# Patient Record
Sex: Male | Born: 1995 | Race: Black or African American | Hispanic: No | Marital: Single | Smoking: Current some day smoker
Health system: Southern US, Community
[De-identification: ages and names within clinical notes are randomized; demographics above are authoritative.]

## PROBLEM LIST (undated history)

## (undated) DIAGNOSIS — Z789 Other specified health status: Secondary | ICD-10-CM

## (undated) HISTORY — PX: OTHER SURGICAL HISTORY: SHX169

## (undated) HISTORY — PX: NO PAST SURGERIES: SHX2092

---

## 2018-04-15 ENCOUNTER — Encounter (HOSPITAL_BASED_OUTPATIENT_CLINIC_OR_DEPARTMENT_OTHER): Payer: Self-pay | Admitting: *Deleted

## 2018-04-15 ENCOUNTER — Emergency Department (HOSPITAL_BASED_OUTPATIENT_CLINIC_OR_DEPARTMENT_OTHER): Payer: Self-pay

## 2018-04-15 ENCOUNTER — Other Ambulatory Visit: Payer: Self-pay

## 2018-04-15 ENCOUNTER — Emergency Department (HOSPITAL_BASED_OUTPATIENT_CLINIC_OR_DEPARTMENT_OTHER)
Admission: EM | Admit: 2018-04-15 | Discharge: 2018-04-15 | Disposition: A | Discharge status: Home / Self Care | Payer: Self-pay | Attending: Emergency Medicine | Admitting: Emergency Medicine

## 2018-04-15 DIAGNOSIS — Y999 Unspecified external cause status: Secondary | ICD-10-CM | POA: Insufficient documentation

## 2018-04-15 DIAGNOSIS — Y9389 Activity, other specified: Secondary | ICD-10-CM | POA: Insufficient documentation

## 2018-04-15 DIAGNOSIS — F17200 Nicotine dependence, unspecified, uncomplicated: Secondary | ICD-10-CM | POA: Insufficient documentation

## 2018-04-15 DIAGNOSIS — W1781XA Fall down embankment (hill), initial encounter: Secondary | ICD-10-CM | POA: Insufficient documentation

## 2018-04-15 DIAGNOSIS — Y9289 Other specified places as the place of occurrence of the external cause: Secondary | ICD-10-CM | POA: Insufficient documentation

## 2018-04-15 DIAGNOSIS — S0292XA Unspecified fracture of facial bones, initial encounter for closed fracture: Secondary | ICD-10-CM

## 2018-04-15 DIAGNOSIS — S52121A Displaced fracture of head of right radius, initial encounter for closed fracture: Secondary | ICD-10-CM

## 2018-04-15 MED ORDER — CLINDAMYCIN HCL 300 MG PO CAPS: 300.0000 mg | ORAL_CAPSULE | Freq: Four times a day (QID) | ORAL | 0 refills | Status: AC

## 2018-04-15 MED ORDER — HYDROCODONE-ACETAMINOPHEN 5-325 MG PO TABS: 1.0000 | ORAL_TABLET | Freq: Four times a day (QID) | ORAL | 0 refills | Status: AC | PRN

## 2018-04-15 MED FILL — CLINDAMYCIN HCL 300 MG CAP: 300 | 7 days supply | Qty: 28 | Fill #0

## 2018-04-15 MED FILL — HYDROCODON-APAP 5-325: 5-325 | 2 days supply | Qty: 10 | Fill #0

## 2018-04-15 NOTE — ED Provider Notes (Signed)
[facility] EMERGENCY DEPARTMENT Provider Note   CSN: [id number] Arrival date & time: 04/15/18  0915     History   Chief Complaint Chief Complaint  Patient presents with  . Face Pain; Arm pain    HPI Anthony Aguilar is a 22 y.o. male.  HPI Patient states that 2 days ago he fell down a hill striking his face and arm.  He denies any loss of consciousness.  States he had swelling to the right side of his face and right arm that he noticed the following day.  Denies headache or neck pain.  Denies any visual changes.  States he is having some difficulty moving his right elbow.  No focal weakness or numbness. History reviewed. No pertinent past medical history.  There are no active problems to display for this patient.   Past Surgical History:  Procedure Laterality Date  . Surgery to Right index finger          Home Medications    Prior to Admission medications   Medication Sig Start Date End Date Taking? Authorizing Provider  clindamycin (CLEOCIN) 300 MG capsule Take 1 capsule (300 mg total) by mouth 4 (four) times daily. X 7 days 04/15/18   Loren Racer, MD  HYDROcodone-acetaminophen [facility]) 5-325 MG tablet Take 1 tablet by mouth every 6 (six) hours as needed for severe pain. 04/15/18   Loren Racer, MD    Family History History reviewed. No pertinent family history.  Social History Social History   Tobacco Use  . Smoking status: Current Every Day Smoker  . Smokeless tobacco: Never Used  . Tobacco comment: occasionally  Substance Use Topics  . Alcohol use: Yes    Comment: occasionally  . Drug use: Never     Allergies   Patient has no known allergies.   Review of Systems Review of Systems  Constitutional: Negative for chills and fever.  HENT: Positive for facial swelling.   Eyes: Negative for pain and visual disturbance.  Respiratory: Negative for shortness of breath.   Cardiovascular: Negative for chest pain, palpitations and leg swelling.    Gastrointestinal: Negative for abdominal pain, constipation, diarrhea, nausea and vomiting.  Musculoskeletal: Positive for arthralgias and joint swelling. Negative for neck pain.  Skin: Positive for wound. Negative for rash.  Neurological: Negative for dizziness, syncope, weakness, numbness and headaches.  All other systems reviewed and are negative.    Physical Exam Updated Vital Signs BP (!) 155/97 (BP Location: Left Arm)   Pulse 93   Temp 98.2 F (36.8 C) (Oral)   Resp 16   Ht 5' 11.5" (1.816 m)   Wt 117.9 kg (260 lb)   SpO2 99%   BMI 35.76 kg/m   Physical Exam  Constitutional: He is oriented to person, place, and time. He appears well-developed and well-nourished.  HENT:  Head: Normocephalic.  Mouth/Throat: Oropharynx is clear and moist.  Patient with abrasions to his forehead and left upper cheek.  Swelling to the right forehead and right maxillary region.  Minimal tenderness at the base of the toes.  No trismus.  No occlusion.  No hemotympanum.  Eyes: Pupils are equal, round, and reactive to light. EOM are normal.  No evidence of hyphema.  Pupils are regular.  Neck: Normal range of motion. Neck supple.  No posterior midline cervical tenderness to palpation.  Cardiovascular: Normal rate and regular rhythm. Exam reveals no gallop and no friction rub.  No murmur heard. Pulmonary/Chest: Effort normal and breath sounds normal. No stridor. No  respiratory distress. He has no wheezes. He has no rales. He exhibits no tenderness.  Abdominal: Soft. Bowel sounds are normal. There is no tenderness. There is no rebound and no guarding.  Musculoskeletal: He exhibits edema and tenderness.  Patient with tenderness over the radial head of the right elbow.  Decreased range of motion due to pain.  Diffuse swelling to the elbow and proximal forearm.  Full range of motion of the right shoulder and right wrist.  Distal pulses are 2+.  No midline thoracic or lumbar tenderness.  Neurological: He  is alert and oriented to person, place, and time.  5/5 motor in all extremities.  Sensation fully intact.  Skin: Skin is warm and dry. Capillary refill takes less than 2 seconds. No rash noted. No erythema.  Psychiatric: He has a normal mood and affect. His behavior is normal.  Nursing note and vitals reviewed.    ED Treatments / Results  Labs (all labs ordered are listed, but only abnormal results are displayed) Labs Reviewed - No data to display  EKG None  Radiology Dg Elbow Complete Right  Result Date: 04/15/2018 CLINICAL DATA:  Right elbow pain since an injury suffered in a fall last night. Initial encounter. EXAM: RIGHT ELBOW - COMPLETE 3+ VIEW COMPARISON:  None. FINDINGS: The patient has a fracture of the radial head and neck with an associated elbow joint effusion. The fracture extends to the articular surface of the radius but no step-off at the articular surface is identified. No other bony or joint abnormality is seen. IMPRESSION: Slightly impacted fracture of the radial neck extends to the articular surface of the radius but no step-off at the articular surface is identified. The exam is otherwise negative. Electronically Signed   By: Drusilla Kanner M.D.   On: 04/15/2018 10:14   Ct Maxillofacial Wo Contrast  Result Date: 04/15/2018 CLINICAL DATA:  Pain following fall on concrete EXAM: CT MAXILLOFACIAL WITHOUT CONTRAST TECHNIQUE: Multidetector CT imaging of the maxillofacial structures was performed. Multiplanar CT image reconstructions were also generated. COMPARISON:  None. FINDINGS: Osseous: There is a comminuted fracture of the right nasal bone with depression of fractures fragments of the anterior right nasal bone posteriorly. There is a nondisplaced fracture of the anterior left nasal bone. There is a fracture along the right frontal sinus anteriorly with a small area of bony depression and thickening slightly to the right of midline in the right frontal region. There is also  a fracture of the superior right orbital wall with minimal displacement of fracture fragments in this area. There is a fracture of the medial right orbital wall with alignment near anatomic. Orbits: There is soft tissue swelling in a preseptal location over the right orbit. There are no intraorbital masses or hematomas. The globes appear symmetric bilaterally. Sinuses: There is mucosal thickening throughout the ethmoid air cells, at least in part due to hemorrhage from the nasal fractures. There is mild mucosal thickening in each superior sphenoid sinus. There is opacification along the superior aspect of the right maxillary antrum. There are small retention cysts in each superior maxillary antrum region. There is also mucosal thickening along the medial walls of each maxillary antrum. There is opacification in the medial right frontal sinus at the site of hemorrhage. There is obstruction of each ostiomeatal unit complex due to sinusitis and likely hemorrhage. There is no nares obstruction. Soft tissues: Soft tissue swelling is present over the nose and upper facial regions. No soft tissue mass or adenopathy is evident.  Salivary glands appear symmetric and normal bilaterally. Tongue and tongue base regions appear normal. Visualized cervical spine appears unremarkable. Limited intracranial: Visualized mastoid air cells are clear. Visualized brain parenchyma appears unremarkable. IMPRESSION: 1. Extensively comminuted fracture of the right nasal bone with depression of nasal bone fracture fragments on the right. Nondisplaced fracture anterior left nasal bone. 2. Comminuted fracture arising from the anterior right frontal sinus region with mild displacement of a fracture fragment in this area and surrounding soft tissue thickening/hemorrhage. 3. Fracture of the superomedial right orbital wall with fracture fragments in near anatomic alignment. There is also a medial orbital fracture on the right toward the superior  aspect with alignment of fracture fragments near anatomic. There is soft tissue swelling and developing hematomas in these areas. There is no postseptal intraorbital edema, hematoma, or mass. Globes appear intact bilaterally. 4. Multifocal paranasal sinus disease with obstruction of both ostiomeatal unit complexes. Some of this paranasal sinus disease undoubtedly is due to hemorrhage from the fractures. Electronically Signed   By: Bretta BangWilliam  Woodruff III M.D.   On: 04/15/2018 11:49    Procedures Procedures (including critical care time)  Medications Ordered in ED Medications - No data to display   Initial Impression / Assessment and Plan / ED Course  I have reviewed the triage vital signs and the nursing notes.  Pertinent labs & imaging results that were available during my care of the patient were reviewed by me and considered in my medical decision making (see chart for details).    Multiple facial fractures and right radial head fracture.  Placed in splint will have follow-up with ortho.  Patient advised against blowing nose.  Will start antibiotics and have follow-up with ENT.   Final Clinical Impressions(s) / ED Diagnoses   Final diagnoses:  Multiple facial fractures, closed, initial encounter (HCC)  Closed displaced fracture of head of right radius, initial encounter    ED Discharge Orders        Ordered    clindamycin (CLEOCIN) 300 MG capsule  4 times daily     04/15/18 1225    HYDROcodone-acetaminophen (NORCO) 5-325 MG tablet  Every 6 hours PRN     04/15/18 1225       Loren RacerYelverton, Bronislaus Verdell, MD 04/15/18 1413

## 2018-04-15 NOTE — ED Notes (Signed)
ED Provider at bedside. 

## 2018-04-15 NOTE — ED Triage Notes (Signed)
Larey SeatFell last Tuesday night with face hitting the concrete first and right arm.

## 2018-04-22 ENCOUNTER — Encounter (HOSPITAL_COMMUNITY): Payer: Self-pay | Admitting: *Deleted

## 2018-04-22 ENCOUNTER — Other Ambulatory Visit: Payer: Self-pay

## 2018-04-22 NOTE — H&P (Signed)
Otolaryngology Clinic Note  HPI:    Anthony Aguilar is a 22 y.o. male patient of Patient Does Not Have Pcp for evaluation of facial injury.  He stumbled going down the hill 1 week ago, landing face first on concrete.  He had pain externally, swelling, and bleeding from the right nostril which stopped spontaneously.  No loss of consciousness.  He did not faint, but simply stumbled.  Subsequently, as the swelling has come down, the nose looks about the same, and breathes about the same.  He had CT scans done 2 days after the injury showing depressed and comminuted fractures of the right greater than left nasal bones, nondisplaced fractures of the anterior table of the right frontal sinus, and minimal fractures into the right orbit.  He has not seen an eye doctor.  He additionally had a fractured right elbow.  He had prescriptions for clindamycin and hydrocodone. PMH/Meds/All/SocHx/FamHx/ROS:   Past Medical History      Past Medical History:  Diagnosis Date  . Allergy       Past Surgical History       Past Surgical History:  Procedure Laterality Date  . FINGER SURGERY        No family history of bleeding disorders, wound healing problems or difficulty with anesthesia.   Social History  Social History        Social History  . Marital status: N/A    Spouse name: N/A  . Number of children: N/A  . Years of education: N/A      Occupational History  . Not on file.       Social History Main Topics  . Smoking status: Light Tobacco Smoker  . Smokeless tobacco: Never Used  . Alcohol use No  . Drug use: No  . Sexual activity: Not on file       Other Topics Concern  . Not on file      Social History Narrative  . No narrative on file       Current Outpatient Prescriptions:  .  clindamycin (CLEOCIN) 300 MG capsule, , Disp: , Rfl: 0 .  HYDROcodone-acetaminophen (NORCO) 5-325 mg per tablet, , Disp: , Rfl: 0 .  HYDROcodone-acetaminophen (NORCO) 5-325 mg per  tablet, Take 1-2 tablets by mouth every 4 (four) hours as needed for up to 5 days for Pain., Disp: 24 tablet, Rfl: 0  A complete ROS was performed with pertinent positives/negatives noted in the HPI. The remainder of the ROS are negative.    Physical Exam:    Ht 1.803 m (5\' 11" )   Wt 117.9 kg (260 lb)   BMI 36.26 kg/m  He is stocky and muscular.  Mental status is appropriate.  Ears are clear.  The external nose reveals a palpable visit in the right nasal bones and overall displacement of the dorsum to the left, concave to the right.  Internally, there is an anterior leftward septal deviation with partial obstruction in the nasal vestibule.  The remainder of the Bony facial contours is intact.  Oral cavity is clear with teeth in good repair.  Oropharynx clear.  Neck unremarkable. Lungs: Clear to auscultation Heart: Regular rate and rhythm without murmurs Abdomen: Soft, active Extremities: Normal configuration Neurologic: Symmetric, grossly intact.       Impression & Plans:   Displaced/depressed comminuted fracture of the nasal bones.  Nasal septal deviation with left-sided obstruction, age-indeterminate.  Plan: We will do a closed reduction with stabilization of the external nose and internal septum.  I will remove nasal packing 5 days after surgery, and the external splint at 10 days.  He already has clindamycin.  I gave him some additional hydrocodone.   Fernande Boyden, MD  04/20/2018

## 2018-04-23 ENCOUNTER — Encounter (HOSPITAL_COMMUNITY): Payer: Self-pay | Admitting: *Deleted

## 2018-04-23 ENCOUNTER — Ambulatory Visit (HOSPITAL_COMMUNITY): Payer: Self-pay | Admitting: Certified Registered Nurse Anesthetist

## 2018-04-23 ENCOUNTER — Encounter (HOSPITAL_COMMUNITY)
Admission: RE | Disposition: A | Discharge status: Home / Self Care | Payer: Self-pay | Source: Ambulatory Visit | Attending: Otolaryngology

## 2018-04-23 ENCOUNTER — Ambulatory Visit (HOSPITAL_COMMUNITY)
Admission: RE | Admit: 2018-04-23 | Discharge: 2018-04-23 | Disposition: A | Discharge status: Home / Self Care | Payer: Self-pay | Source: Ambulatory Visit | Attending: Otolaryngology | Admitting: Otolaryngology

## 2018-04-23 DIAGNOSIS — S022XXA Fracture of nasal bones, initial encounter for closed fracture: Secondary | ICD-10-CM | POA: Insufficient documentation

## 2018-04-23 DIAGNOSIS — W1781XA Fall down embankment (hill), initial encounter: Secondary | ICD-10-CM | POA: Insufficient documentation

## 2018-04-23 DIAGNOSIS — F17200 Nicotine dependence, unspecified, uncomplicated: Secondary | ICD-10-CM | POA: Insufficient documentation

## 2018-04-23 HISTORY — PX: CLOSED REDUCTION NASAL FRACTURE: SHX5365

## 2018-04-23 HISTORY — DX: Other specified health status: Z78.9

## 2018-04-23 LAB — HEMOGLOBIN: HEMOGLOBIN: 14.8 g/dL (ref 13.0–17.0)

## 2018-04-23 SURGERY — CLOSED REDUCTION, FRACTURE, NASAL BONE
Anesthesia: General

## 2018-04-23 MED ORDER — HYDROMORPHONE HCL 1 MG/ML IJ SOLN: 0.2500 mg | INTRAMUSCULAR | Status: DC | PRN

## 2018-04-23 MED ORDER — ONDANSETRON HCL 4 MG/2ML IJ SOLN: 4.0000 mg | Freq: Once | INTRAMUSCULAR | Status: DC | PRN

## 2018-04-23 MED ORDER — LACTATED RINGERS IV SOLN
INTRAVENOUS | Status: DC | PRN
  Administered 2018-04-23: 07:00:00 via INTRAVENOUS

## 2018-04-23 MED ORDER — DEXAMETHASONE SODIUM PHOSPHATE 10 MG/ML IJ SOLN
INTRAMUSCULAR | Status: AC
  Filled 2018-04-23: qty 3

## 2018-04-23 MED ORDER — DEXAMETHASONE SODIUM PHOSPHATE 4 MG/ML IJ SOLN
INTRAMUSCULAR | Status: DC | PRN
  Administered 2018-04-23: 10 mg via INTRAVENOUS

## 2018-04-23 MED ORDER — FENTANYL CITRATE (PF) 100 MCG/2ML IJ SOLN
INTRAMUSCULAR | Status: DC | PRN
  Administered 2018-04-23: 100 ug via INTRAVENOUS
  Administered 2018-04-23: 50 ug via INTRAVENOUS

## 2018-04-23 MED ORDER — ONDANSETRON HCL 4 MG/2ML IJ SOLN
INTRAMUSCULAR | Status: DC | PRN
  Administered 2018-04-23: 4 mg via INTRAVENOUS

## 2018-04-23 MED ORDER — OXYMETAZOLINE HCL 0.05 % NA SOLN
2.0000 | NASAL | Status: AC | PRN
  Administered 2018-04-23 (×2): 2 via NASAL

## 2018-04-23 MED ORDER — PROPOFOL 10 MG/ML IV BOLUS
INTRAVENOUS | Status: DC | PRN
  Administered 2018-04-23: 150 mg via INTRAVENOUS

## 2018-04-23 MED ORDER — ONDANSETRON HCL 4 MG/2ML IJ SOLN
INTRAMUSCULAR | Status: AC
  Filled 2018-04-23: qty 8

## 2018-04-23 MED ORDER — OXYMETAZOLINE HCL 0.05 % NA SOLN
NASAL | Status: AC
  Administered 2018-04-23: 2 via NASAL
  Filled 2018-04-23: qty 15

## 2018-04-23 MED ORDER — PROPOFOL 10 MG/ML IV BOLUS
INTRAVENOUS | Status: AC
  Filled 2018-04-23: qty 20

## 2018-04-23 MED ORDER — MIDAZOLAM HCL 2 MG/2ML IJ SOLN
INTRAMUSCULAR | Status: AC
  Filled 2018-04-23: qty 2

## 2018-04-23 MED ORDER — PHENYLEPHRINE HCL 10 MG/ML IJ SOLN
INTRAMUSCULAR | Status: DC | PRN
  Administered 2018-04-23: 80 ug via INTRAVENOUS

## 2018-04-23 MED ORDER — MEPERIDINE HCL 50 MG/ML IJ SOLN: 6.2500 mg | INTRAMUSCULAR | Status: DC | PRN

## 2018-04-23 MED ORDER — SUCCINYLCHOLINE CHLORIDE 200 MG/10ML IV SOSY
PREFILLED_SYRINGE | INTRAVENOUS | Status: AC
  Filled 2018-04-23: qty 10

## 2018-04-23 MED ORDER — SUCCINYLCHOLINE CHLORIDE 20 MG/ML IJ SOLN
INTRAMUSCULAR | Status: DC | PRN
  Administered 2018-04-23: 140 mg via INTRAVENOUS

## 2018-04-23 MED ORDER — MIDAZOLAM HCL 5 MG/5ML IJ SOLN
INTRAMUSCULAR | Status: DC | PRN
  Administered 2018-04-23: 2 mg via INTRAVENOUS

## 2018-04-23 MED ORDER — FENTANYL CITRATE (PF) 250 MCG/5ML IJ SOLN
INTRAMUSCULAR | Status: AC
  Filled 2018-04-23: qty 5

## 2018-04-23 MED ORDER — NEOMYCIN-POLYMYXIN-HC 3.5-10000-1 OT SUSP
3.0000 [drp] | OTIC | Status: AC
  Administered 2018-04-23: 10 [drp] via OTIC
  Filled 2018-04-23: qty 10

## 2018-04-23 MED ORDER — DEXMEDETOMIDINE HCL 200 MCG/2ML IV SOLN
INTRAVENOUS | Status: DC | PRN
  Administered 2018-04-23: 8 ug via INTRAVENOUS
  Administered 2018-04-23: 12 ug via INTRAVENOUS
  Administered 2018-04-23: 20 ug via INTRAVENOUS

## 2018-04-23 MED ORDER — LIDOCAINE HCL (CARDIAC) PF 100 MG/5ML IV SOSY
PREFILLED_SYRINGE | INTRAVENOUS | Status: DC | PRN
  Administered 2018-04-23: 100 mg via INTRAVENOUS

## 2018-04-23 SURGICAL SUPPLY — 9 items
CRADLE DONUT ADULT HEAD (MISCELLANEOUS) IMPLANT
DRESSING NASAL POPE 10X1.5X2.5 (GAUZE/BANDAGES/DRESSINGS) ×1 IMPLANT
DRSG NASAL POPE 10X1.5X2.5 (GAUZE/BANDAGES/DRESSINGS) ×3
GAUZE SPONGE 4X4 16PLY XRAY LF (GAUZE/BANDAGES/DRESSINGS) ×3 IMPLANT
GLOVE ECLIPSE 8.0 STRL XLNG CF (GLOVE) IMPLANT
KIT SPLINT NASAL DENVER LRG BE (GAUZE/BANDAGES/DRESSINGS) ×3 IMPLANT
KIT TURNOVER KIT B (KITS) ×3 IMPLANT
PAD ARMBOARD 7.5X6 YLW CONV (MISCELLANEOUS) ×6 IMPLANT
SYR 3ML LL SCALE MARK (SYRINGE) ×3 IMPLANT

## 2018-04-23 NOTE — Transfer of Care (Signed)
Immediate Anesthesia Transfer of Care Note  Patient: Anthony Aguilar RecordJarod Jilek  Procedure(s) Performed: CLOSED REDUCTION WITH STABILIZATION NASAL AND NASAL SEPTAL FRACTURE (N/A )  Patient Location: PACU  Anesthesia Type:General  Level of Consciousness: awake, alert , oriented and patient cooperative  Airway & Oxygen Therapy: Patient Spontanous Breathing and Patient connected to face mask oxygen  Post-op Assessment: Report given to RN and Post -op Vital signs reviewed and stable  Post vital signs: Reviewed and stable  Last Vitals:  Vitals Value Taken Time  BP    Temp    Pulse 97 04/23/2018  8:54 AM  Resp 26 04/23/2018  8:54 AM  SpO2 98 % 04/23/2018  8:54 AM  Vitals shown include unvalidated device data.  Last Pain:  Vitals:   04/23/18 0700  TempSrc:   PainSc: 0-No pain      Patients Stated Pain Goal: 3 (04/23/18 0700)  Complications: No apparent anesthesia complications

## 2018-04-23 NOTE — Anesthesia Procedure Notes (Signed)
Procedure Name: Intubation Date/Time: 04/23/2018 8:05 AM Performed by: Derra Shartzer T, CRNA Pre-anesthesia Checklist: Patient identified, Emergency Drugs available, Suction available and Patient being monitored Patient Re-evaluated:Patient Re-evaluated prior to induction Oxygen Delivery Method: Circle system utilized Preoxygenation: Pre-oxygenation with 100% oxygen Induction Type: IV induction Ventilation: Mask ventilation with difficulty and Oral airway inserted - appropriate to patient size Laryngoscope Size: Mac and 4 Grade View: Grade II Tube type: Oral Rae Tube size: 7.5 mm Number of attempts: 2 Airway Equipment and Method: Patient positioned with wedge pillow Placement Confirmation: ETT inserted through vocal cords under direct vision,  positive ETCO2 and breath sounds checked- equal and bilateral Tube secured with: Tape Dental Injury: Teeth and Oropharynx as per pre-operative assessment

## 2018-04-23 NOTE — Anesthesia Preprocedure Evaluation (Signed)
Anesthesia Evaluation  Patient identified by MRN, date of birth, ID band Patient awake    Reviewed: Allergy & Precautions, NPO status , Patient's Chart, lab work & pertinent test results  Airway Mallampati: I  TM Distance: >3 FB Neck ROM: Full    Dental   Pulmonary Current Smoker,    Pulmonary exam normal        Cardiovascular Normal cardiovascular exam     Neuro/Psych    GI/Hepatic   Endo/Other    Renal/GU      Musculoskeletal   Abdominal   Peds  Hematology   Anesthesia Other Findings   Reproductive/Obstetrics                             Anesthesia Physical Anesthesia Plan  ASA: II  Anesthesia Plan: General   Post-op Pain Management:    Induction: Intravenous  PONV Risk Score and Plan: 1 and Ondansetron  Airway Management Planned: Oral ETT  Additional Equipment:   Intra-op Plan:   Post-operative Plan: Extubation in OR  Informed Consent: I have reviewed the patients History and Physical, chart, labs and discussed the procedure including the risks, benefits and alternatives for the proposed anesthesia with the patient or authorized representative who has indicated his/her understanding and acceptance.       Plan Discussed with: CRNA and Surgeon  Anesthesia Plan Comments:         Anesthesia Quick Evaluation  

## 2018-04-23 NOTE — Discharge Instructions (Signed)
Ice pack x 24 hrs please Keep head elevated 3-4 nights Recheck my office 5 days for pack removal. Take pain medication before this visit.  [phone] for an appointment Continue Clindamycin Drip pad if needed.   Diet as comfortable

## 2018-04-23 NOTE — Op Note (Signed)
04/23/2018  8:54 AM    Laurice RecordJones, Tobie  [id number]   Pre-Op Dx:  Closed displaced nasal and nasal septal fractures with displacement  Post-op Dx:  same  Proc: closed reduction nasal and nasal septal fractures with stabilization   Surg:  Flo ShanksWOLICKI, Eniyah Eastmond T MD  Anes:  GOT  EBL:  none  Comp:  none  Findings:  Depressed and leftward displaced bony nasal dorsum.  Right anterior maxillary crest spurring and nasal septal deviation  Procedure: the patient received preoperative Afrin spray for topical decongestion and hemostasis.  In the OR on the patient gurney, general orotracheal anesthesia was induced without difficulty. The patient was placed in a slight sitting position with the head rotated to the right.  A routine surgical timeout was obtained.  The nose was examined externally and internally with the findings as described above.  A blunt fracture elevator was measured to the level of the medial canthus and placed under the dorsum of the nose. With anterior and right lateral pressure, the nasal dorsum was elevated and reduced into a midline position.  The Asch forceps was used to mobilize the septum into midline position. Hemostasis was observed.  A small-medium [place] splint was fashioned and applied to the external nose in the standard fashion.  Merocel sponges were shortened and placed into the nose on each side to support the septum and moistened with Cortisporin otic suspension.  At this point the procedure was completed. The patient was returned to anesthesia, awakened, extubated, and transferred to recovery in stable condition.  Dispo:   PACU to home  Plan:  Ice, elevation, drip pad. We'll have him on analgesia, and antibiosis. Packs out in 5 days, septal splint off in 10 days.  Cephus RicherWOLICKI,  Chandlar Guice T MD

## 2018-04-23 NOTE — Interval H&P Note (Signed)
History and Physical Interval Note:  04/23/2018 7:49 AM  [name]  has presented today for surgery, with the diagnosis of CLOSED DISPLACED NASAL AND NASAL SEPTAL FRACTURE  The various methods of treatment have been discussed with the patient and family. After consideration of risks, benefits and other options for treatment, the patient has consented to  Procedure(s): CLOSED REDUCTION WITH STABILIZATION NASAL AND NASAL SEPTAL FRACTURE (N/A) as a surgical intervention .  The patient's history has been re-reviewed, patient re-examined, no change in status, stable for surgery.  I have re-reviewed the patient's chart and labs.  Questions were answered to the patient's satisfaction.     Flo ShanksWOLICKI, [place] Huntsman

## 2018-04-23 NOTE — Anesthesia Postprocedure Evaluation (Signed)
Anesthesia Post Note  Patient: Laurice RecordJarod Barca  Procedure(s) Performed: CLOSED REDUCTION WITH STABILIZATION NASAL AND NASAL SEPTAL FRACTURE (N/A )     Patient location during evaluation: PACU Anesthesia Type: General Level of consciousness: awake and alert Pain management: pain level controlled Vital Signs Assessment: post-procedure vital signs reviewed and stable Respiratory status: spontaneous breathing, nonlabored ventilation, respiratory function stable and patient connected to nasal cannula oxygen Cardiovascular status: blood pressure returned to baseline and stable Postop Assessment: no apparent nausea or vomiting Anesthetic complications: no    Last Vitals:  Vitals:   04/23/18 0942 04/23/18 0945  BP: (!) 142/87   Pulse: 100 87  Resp: 19 13  Temp:    SpO2: 97% 98%    Last Pain:  Vitals:   04/23/18 1000  TempSrc:   PainSc: 0-No pain                 Lynleigh Kovack DAVID

## 2018-04-24 ENCOUNTER — Encounter (HOSPITAL_COMMUNITY): Payer: Self-pay | Admitting: Otolaryngology

## 2020-04-19 IMAGING — DX DG ELBOW COMPLETE 3+V*R*
4 series · 4 of 4 positions shown · non-contrast
Comparison: None.

CLINICAL DATA: Right elbow pain since an injury suffered in a fall
last night. Initial encounter.

EXAM:
RIGHT ELBOW - COMPLETE 3+ VIEW

[elbow ap]
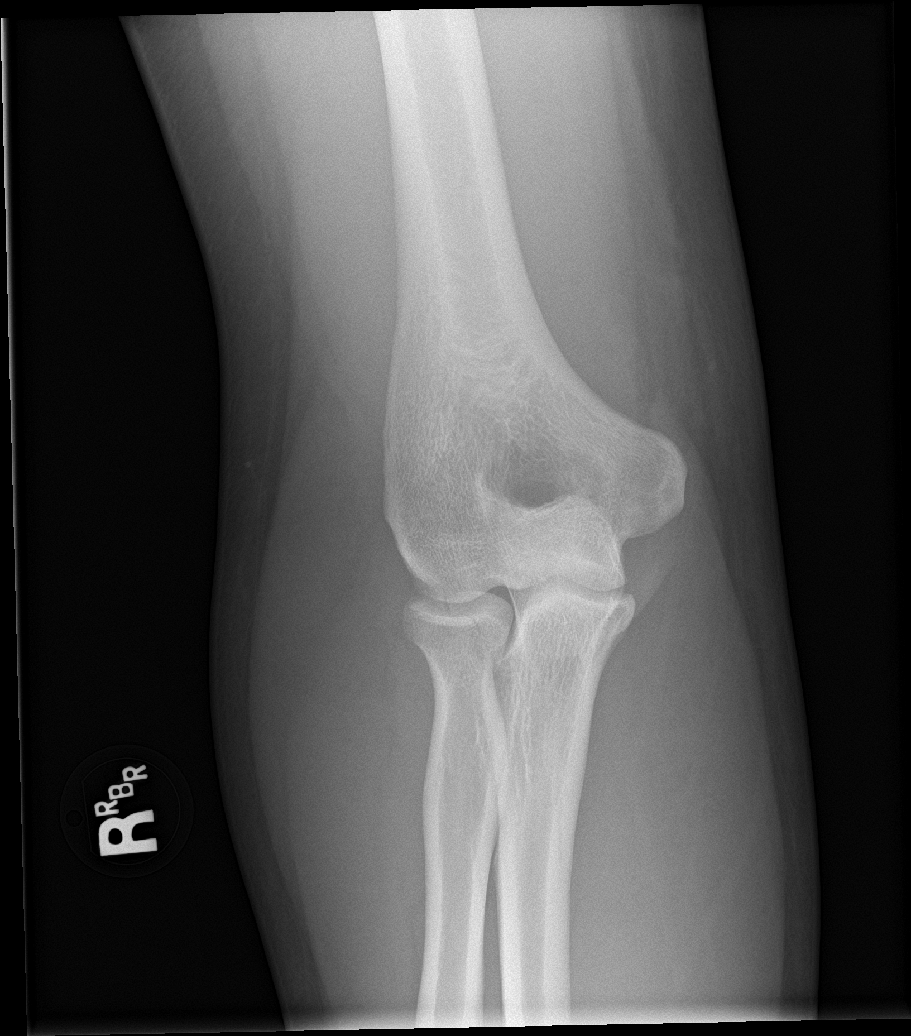

[elbow obl (1 of 2)]
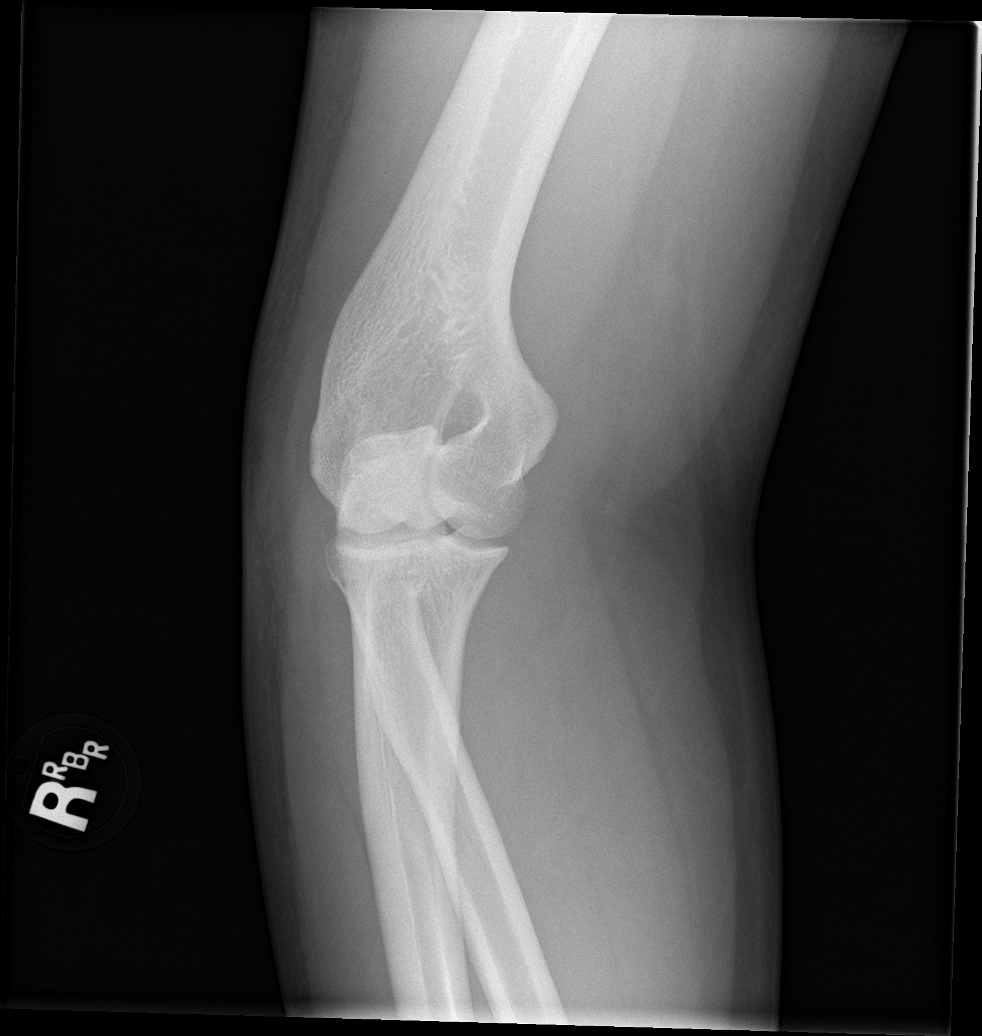

[elbow obl (2 of 2)]
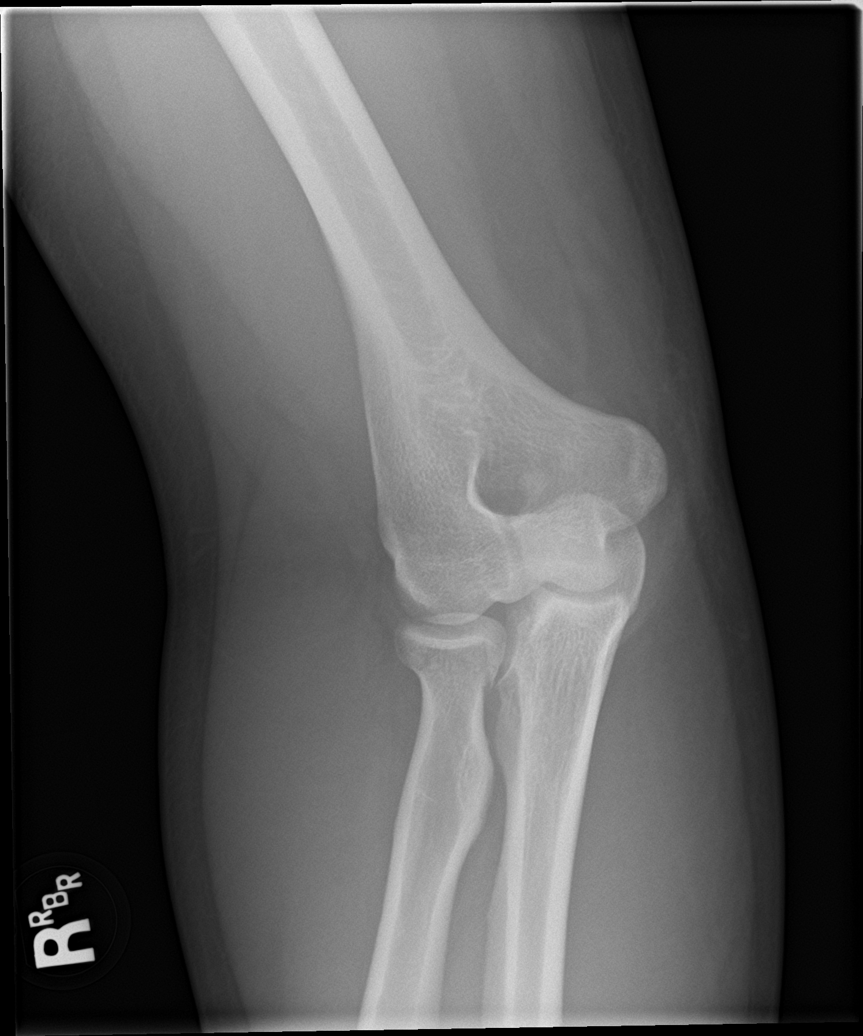

[elbow lat]
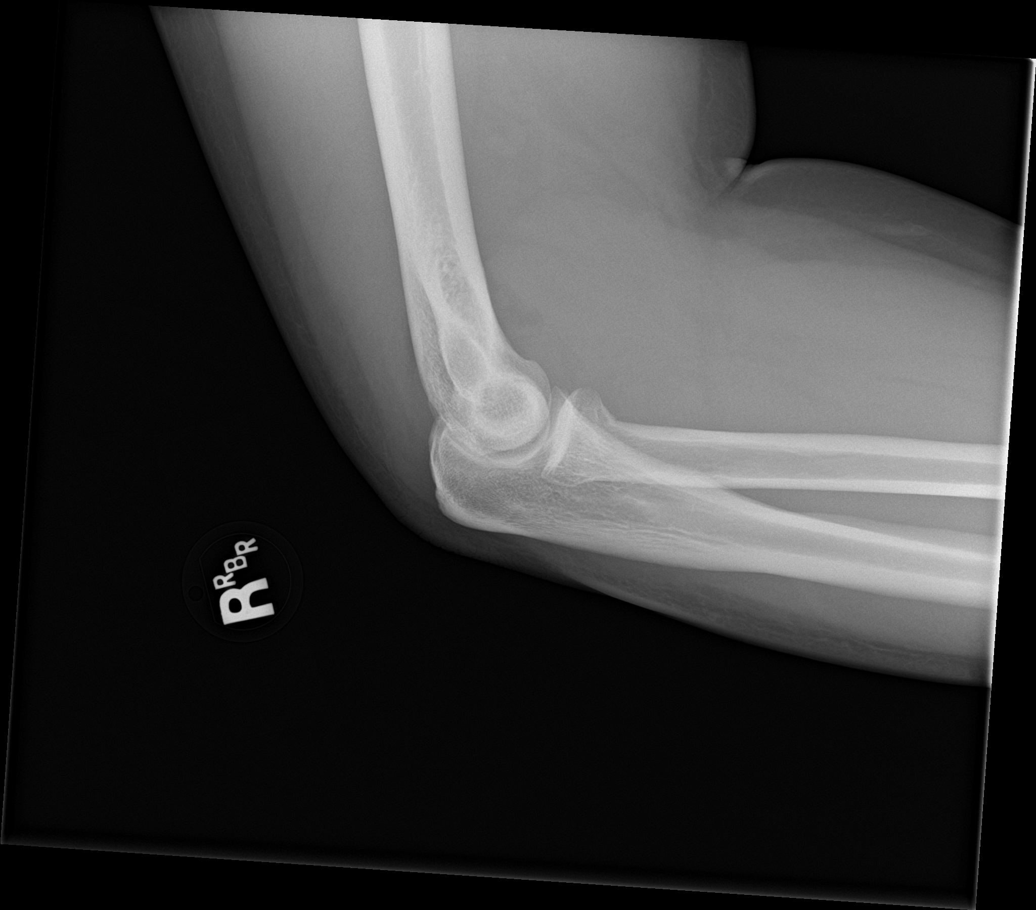

[4 of 4 positions shown; findings below may reference images not displayed]

FINDINGS: The patient has a fracture of the radial head and neck with an
associated elbow joint effusion. The fracture extends to the
articular surface of the radius but no step-off at the articular
surface is identified. No other bony or joint abnormality is seen.
IMPRESSION: Slightly impacted fracture of the radial neck extends to the
articular surface of the radius but no step-off at the articular
surface is identified. The exam is otherwise negative.

## 2023-09-02 ENCOUNTER — Emergency Department (HOSPITAL_BASED_OUTPATIENT_CLINIC_OR_DEPARTMENT_OTHER): Payer: Self-pay

## 2023-09-02 ENCOUNTER — Emergency Department (HOSPITAL_BASED_OUTPATIENT_CLINIC_OR_DEPARTMENT_OTHER): Payer: BLUE CROSS/BLUE SHIELD

## 2023-09-02 ENCOUNTER — Other Ambulatory Visit: Payer: Self-pay

## 2023-09-02 ENCOUNTER — Encounter (HOSPITAL_BASED_OUTPATIENT_CLINIC_OR_DEPARTMENT_OTHER): Payer: Self-pay

## 2023-09-02 ENCOUNTER — Emergency Department (HOSPITAL_BASED_OUTPATIENT_CLINIC_OR_DEPARTMENT_OTHER)
Admission: EM | Admit: 2023-09-02 | Discharge: 2023-09-03 | Disposition: A | Discharge status: Home / Self Care | Payer: BLUE CROSS/BLUE SHIELD | Attending: Emergency Medicine | Admitting: Emergency Medicine

## 2023-09-02 DIAGNOSIS — M542 Cervicalgia: Secondary | ICD-10-CM | POA: Diagnosis not present

## 2023-09-02 DIAGNOSIS — R519 Headache, unspecified: Secondary | ICD-10-CM | POA: Diagnosis not present

## 2023-09-02 DIAGNOSIS — R1030 Lower abdominal pain, unspecified: Secondary | ICD-10-CM | POA: Diagnosis not present

## 2023-09-02 DIAGNOSIS — S20219A Contusion of unspecified front wall of thorax, initial encounter: Secondary | ICD-10-CM | POA: Diagnosis not present

## 2023-09-02 DIAGNOSIS — Y92410 Unspecified street and highway as the place of occurrence of the external cause: Secondary | ICD-10-CM | POA: Diagnosis not present

## 2023-09-02 DIAGNOSIS — F17290 Nicotine dependence, other tobacco product, uncomplicated: Secondary | ICD-10-CM | POA: Diagnosis not present

## 2023-09-02 DIAGNOSIS — R079 Chest pain, unspecified: Secondary | ICD-10-CM | POA: Diagnosis present

## 2023-09-02 DIAGNOSIS — R000 Tachycardia, unspecified: Secondary | ICD-10-CM | POA: Diagnosis not present

## 2023-09-02 MED ORDER — OXYCODONE HCL 5 MG PO TABS
5.0000 mg | ORAL_TABLET | Freq: Once | ORAL | Status: AC
Start: 2023-09-02 — End: 2023-09-02
  Administered 2023-09-02: 5 mg via ORAL
  Filled 2023-09-02: qty 1
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
## 2023-09-02 NOTE — ED Notes (Signed)
Per MD Wallace Cullens, only CT's and XR for now.
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
## 2023-09-03 ENCOUNTER — Emergency Department (HOSPITAL_BASED_OUTPATIENT_CLINIC_OR_DEPARTMENT_OTHER): Payer: BLUE CROSS/BLUE SHIELD

## 2023-09-03 LAB — COMPREHENSIVE METABOLIC PANEL
ALT: 50 U/L — ABNORMAL HIGH (ref 0–44)
AST: 32 U/L (ref 15–41)
Albumin: 4.2 g/dL (ref 3.5–5.0)
Alkaline Phosphatase: 63 U/L (ref 38–126)
Anion gap: 10 (ref 5–15)
BUN: 8 mg/dL (ref 6–20)
CO2: 23 mmol/L (ref 22–32)
Calcium: 9 mg/dL (ref 8.9–10.3)
Chloride: 103 mmol/L (ref 98–111)
Creatinine, Ser: 0.93 mg/dL (ref 0.61–1.24)
GFR, Estimated: >60 mL/min (ref >60)
Glucose, Bld: 94 mg/dL (ref 70–99)
Potassium: 3.8 mmol/L (ref 3.5–5.1)
Sodium: 136 mmol/L (ref 135–145)
Total Bilirubin: 0.8 mg/dL (ref <1.2)
Total Protein: 7.9 g/dL (ref 6.5–8.1)

## 2023-09-03 LAB — CBC
HCT: 45.3 % (ref 39.0–52.0)
Hemoglobin: 14.3 g/dL (ref 13.0–17.0)
MCH: 26.8 pg (ref 26.0–34.0)
MCHC: 31.6 g/dL (ref 30.0–36.0)
MCV: 84.8 fL (ref 80.0–100.0)
Platelets: 333 10*3/uL (ref 150–400)
RBC: 5.34 MIL/uL (ref 4.22–5.81)
RDW: 14.4 % (ref 11.5–15.5)
WBC: 11.1 10*3/uL — ABNORMAL HIGH (ref 4.0–10.5)
nRBC: 0 % (ref 0.0–0.2)

## 2023-09-03 MED ORDER — NAPROXEN 500 MG PO TABS: 500.0000 mg | ORAL_TABLET | Freq: Two times a day (BID) | ORAL | 0 refills | Status: AC

## 2023-09-03 MED ORDER — IOHEXOL 300 MG/ML  SOLN
125.0000 mL | Freq: Once | INTRAMUSCULAR | Status: AC | PRN
  Administered 2023-09-03: 125 mL via INTRAVENOUS

## 2023-09-03 MED ORDER — METHOCARBAMOL 500 MG PO TABS: 500.0000 mg | ORAL_TABLET | Freq: Three times a day (TID) | ORAL | 0 refills | Status: AC | PRN

## 2023-09-03 NOTE — Discharge Instructions (Signed)
You were evaluated in the Emergency Department and after careful evaluation, we did not find any emergent condition requiring admission or further testing in the hospital.  Your exam/testing today is overall reassuring.  CT scans did not show any significant traumatic injuries.  Suspect you have rib bruising.  Recommend using the Naprosyn twice daily as prescribed for pain.  Can use the Robaxin muscle relaxer for more significant pain, best used at night if you are having trouble sleeping as it can cause drowsiness.  Please return to the Emergency Department if you experience any worsening of your condition.   Thank you for allowing [place] to be a part of your care.

## 2023-09-03 NOTE — ED Notes (Signed)
Patient transported to CT
